# Patient Record
Sex: Male | Born: 2000 | Race: Black or African American | Hispanic: No | Marital: Single | Smoking: Never smoker
Health system: Southern US, Community
[De-identification: ages and names within clinical notes are randomized; demographics above are authoritative.]

## PROBLEM LIST (undated history)

## (undated) DIAGNOSIS — J45909 Unspecified asthma, uncomplicated: Secondary | ICD-10-CM

## (undated) HISTORY — PX: WISDOM TOOTH EXTRACTION: SHX21

---
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
## 2023-09-16 IMAGING — CT CT 3D ACQUISTION WKST
4 of 6 series · 14 of 36 positions shown, 16 images · non-contrast
Comparison: Radiographs 09/06/2021

CLINICAL DATA: Fall 3-4 weeks ago.  Left radial head fracture.

EXAM:
CT OF THE UPPER LEFT EXTREMITY WITHOUT CONTRAST
3-DIMENSIONAL CT IMAGE RENDERING ON ACQUISITION WORKSTATION
TECHNIQUE: Multidetector CT imaging of the left elbow was performed according
to the standard protocol.

[Series 3: elbow 1.50 br60 s3 axial bone hd fov · axial · 0.38mm/px · z∈[-732,-603]mm · 5 of 244 slices shown, 7 images]
[im 41/244  soft-tissue]
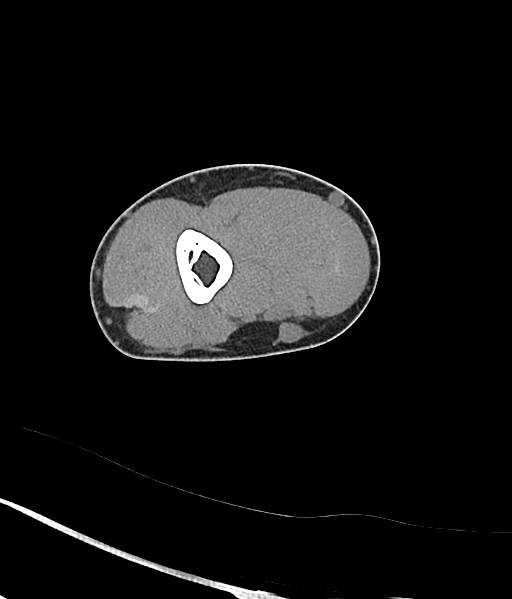
[im 41/244  bone]
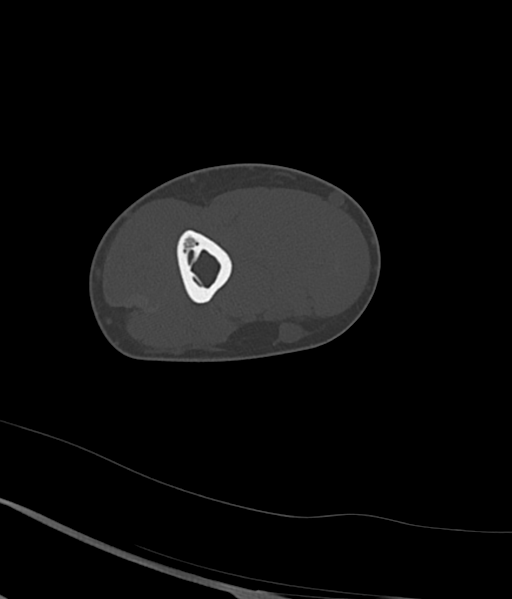
[im 82/244  bone]
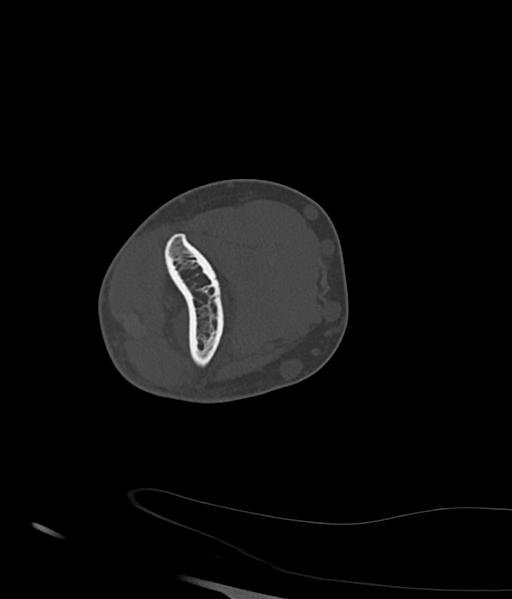
[im 122/244  bone]
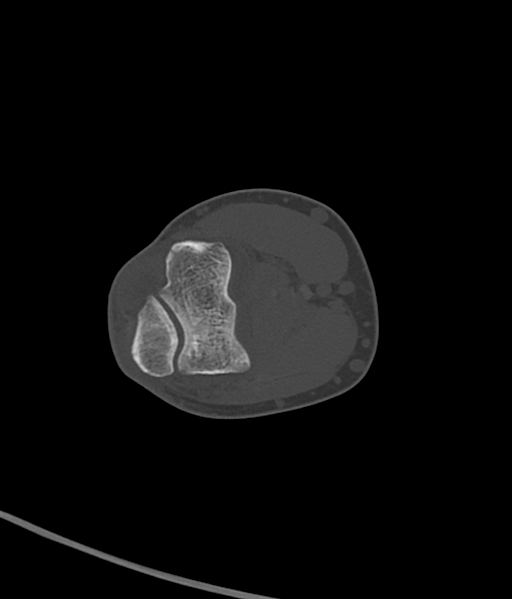
[im 163/244  bone]
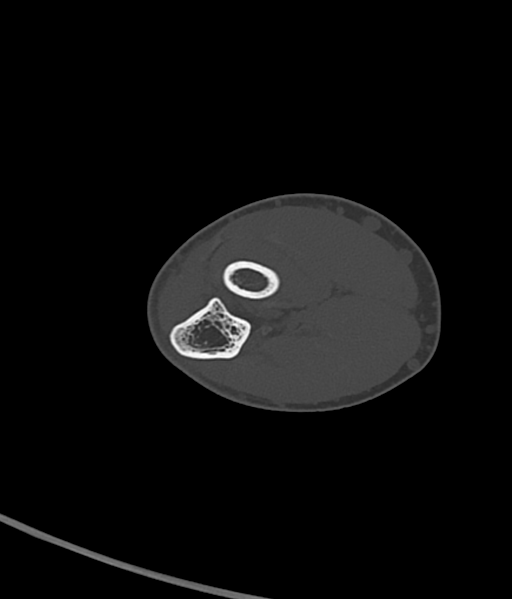
[im 203/244  soft-tissue]
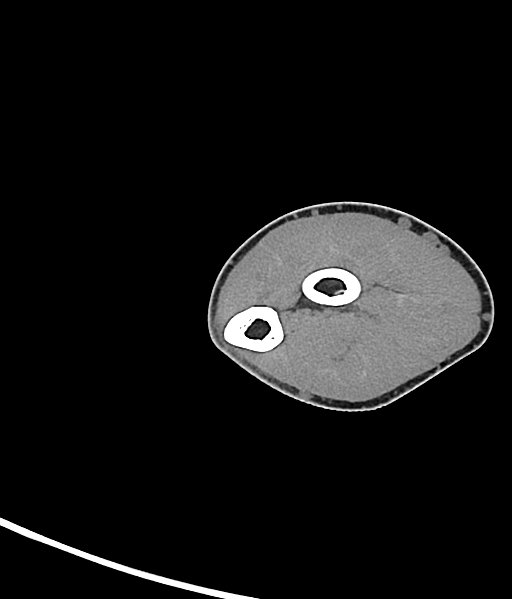
[im 203/244  bone]
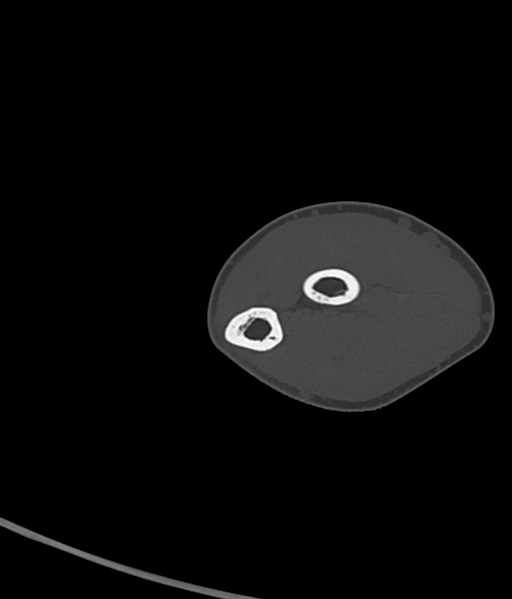

[Series 5: elbow 1.50 br40 s3 axial st hd fov · axial · 0.38mm/px · z∈[-732,-701]mm · 2 of 243 slices shown]
[im 41/243  bone]
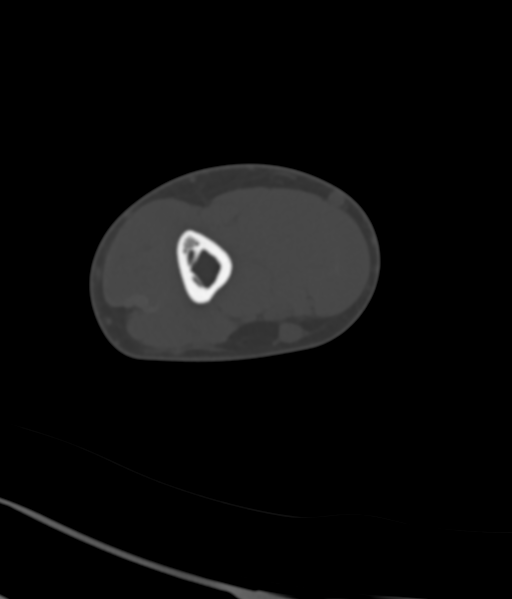
[im 81/243  bone]
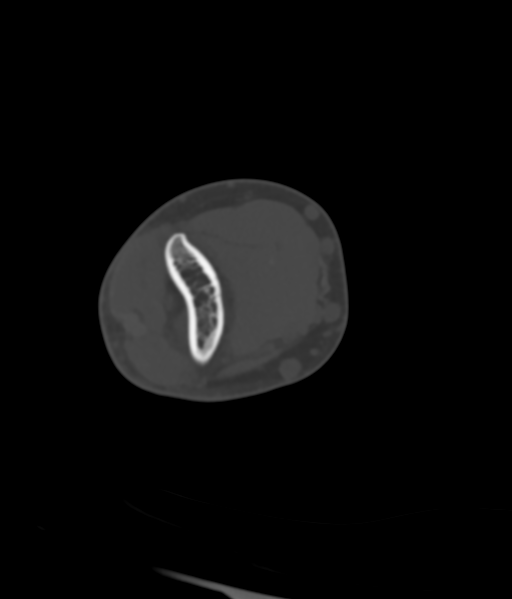

[Series 9: elbow 1.50 hr60 s3 cor bone hd fov · coronal · 0.38mm/px · 1 of 280 slices shown]
[im 140/280  bone]
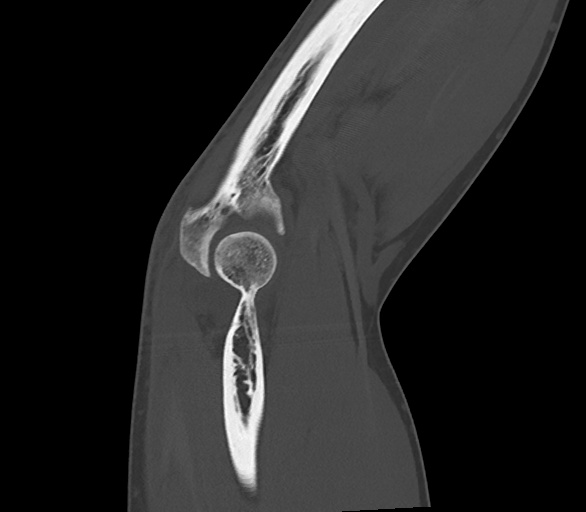

[Series 13: elbow 1.50 br60 s3 sag bone hd fov · sagittal · 0.40mm/px · 6 of 280 slices shown]
[im 47/280  bone]
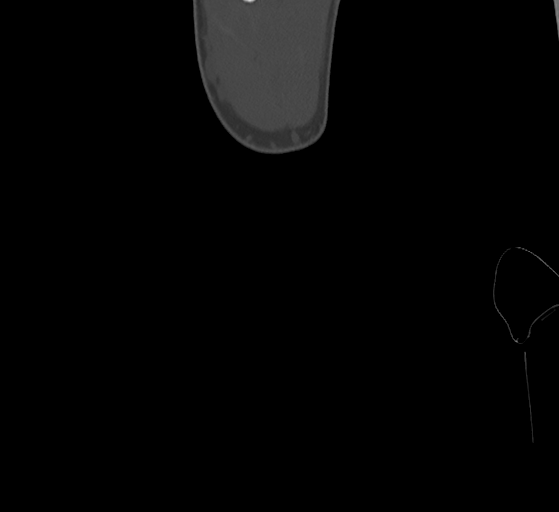
[im 48/280  soft-tissue]
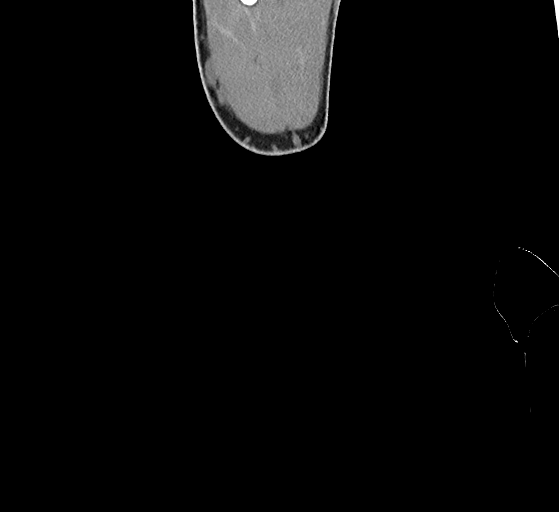
[im 94/280  bone]
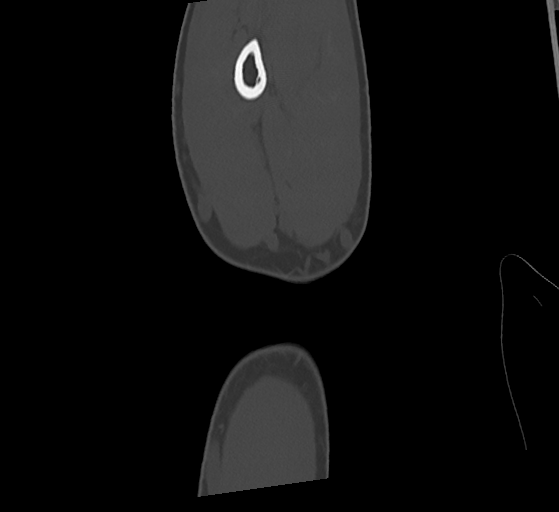
[im 140/280  bone]
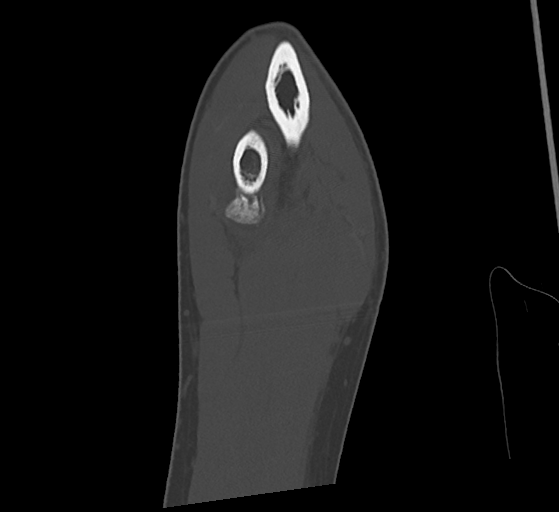
[im 187/280  bone]
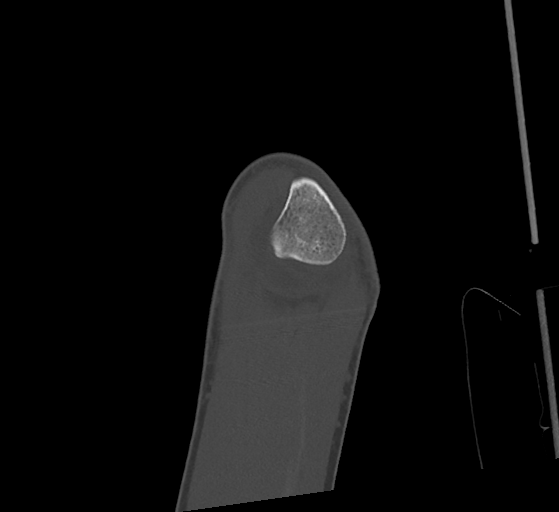
[im 233/280  bone]
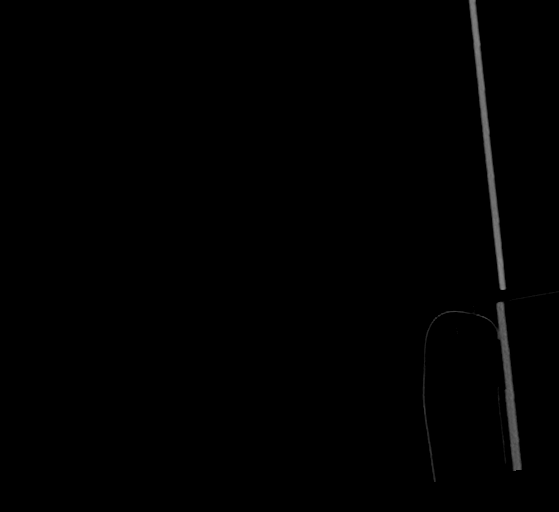

[14 of 36 positions shown; findings below may reference images not displayed]

3-dimensional CT images were rendered by post-processing of the
original CT data on an acquisition workstation. The 3-dimensional CT
images were interpreted and findings were reported in the
accompanying complete CT report for this study

RADIATION DOSE REDUCTION: This exam was performed according to the
departmental dose-optimization program which includes automated
exposure control, adjustment of the mA and/or kV according to
patient size and/or use of iterative reconstruction technique.
FINDINGS: Bones/Joint/Cartilage

As seen radiographically, there is a comminuted and depressed
intra-articular fracture involving the anterior 3rd of the left
radial head. There is up to 4 mm of displacement of the articular
surface which is depressed by 3 mm anteriorly. The radial head
remains located. There is a linear fracture fragment anteriorly
within the joint, measuring up to 9 mm in greatest dimension on
sagittal image 156/9. There is a moderate to large hemarthrosis. The
proximal ulna and distal humerus are intact.

Ligaments

Suboptimally assessed by CT.

Muscles and Tendons

The biceps, brachialis and triceps tendons appear intact. No focal
muscular abnormalities are seen.

Soft tissues

No foreign body, soft tissue emphysema or focal fluid collection.
Soft tissue swelling noted posterolaterally.
IMPRESSION: 1. Comminuted intra-articular fracture involving the anterior 3rd of
the left radial head as described. There is a large displaced
fracture fragment anteriorly in coronoid fossa and the articular
surface of the radial head is depressed by up to 3 mm.
2. Large hemarthrosis.
3. The distal humerus and proximal ulna are intact.  No dislocation.

## 2023-09-16 IMAGING — CT CT ELBOW*L* W/O CM
4 of 6 series · 14 of 36 positions shown, 16 images · non-contrast
Comparison: Radiographs 09/06/2021

CLINICAL DATA: Fall 3-4 weeks ago.  Left radial head fracture.

EXAM:
CT OF THE UPPER LEFT EXTREMITY WITHOUT CONTRAST
3-DIMENSIONAL CT IMAGE RENDERING ON ACQUISITION WORKSTATION
TECHNIQUE: Multidetector CT imaging of the left elbow was performed according
to the standard protocol.

[Series 3: elbow 1.50 br60 s3 axial bone hd fov · axial · 0.38mm/px · z∈[-732,-603]mm · 5 of 244 slices shown, 7 images]
[im 41/244  soft-tissue]
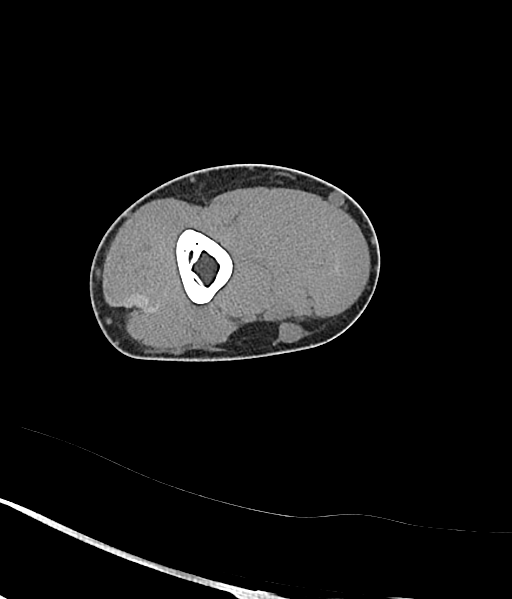
[im 41/244  bone]
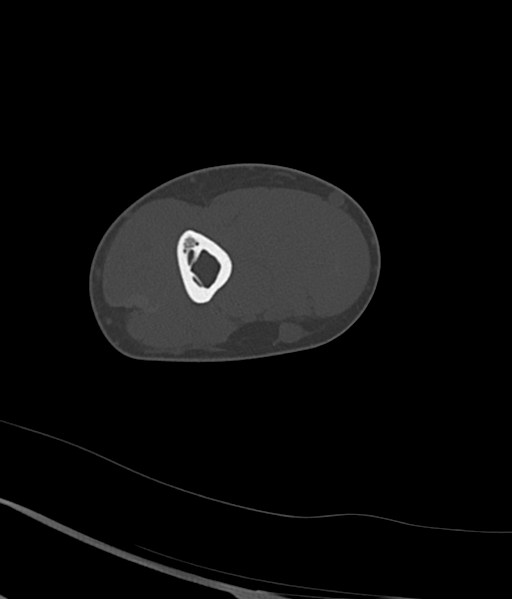
[im 82/244  bone]
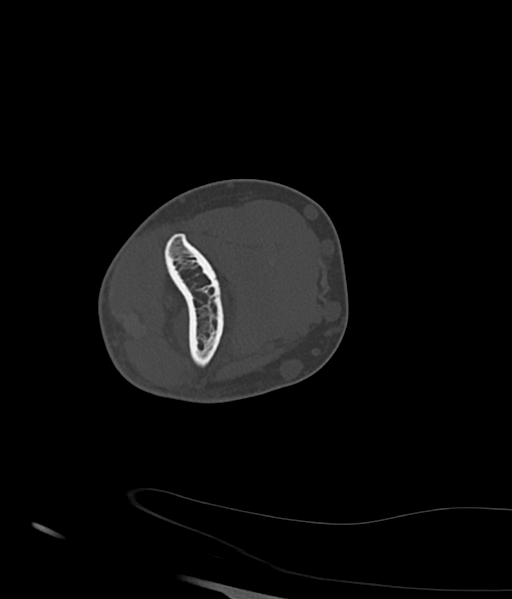
[im 122/244  bone]
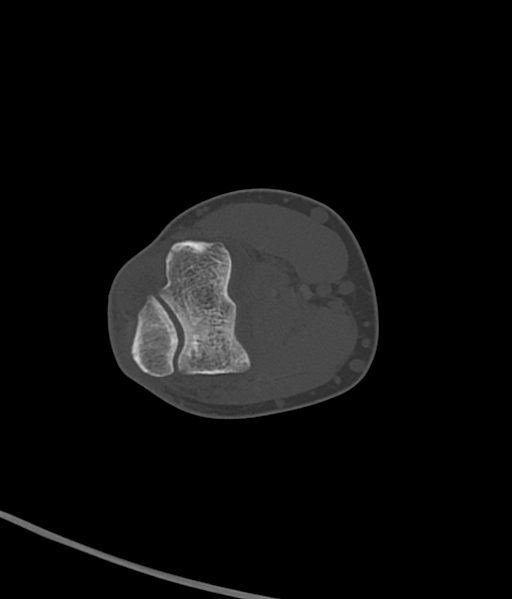
[im 163/244  bone]
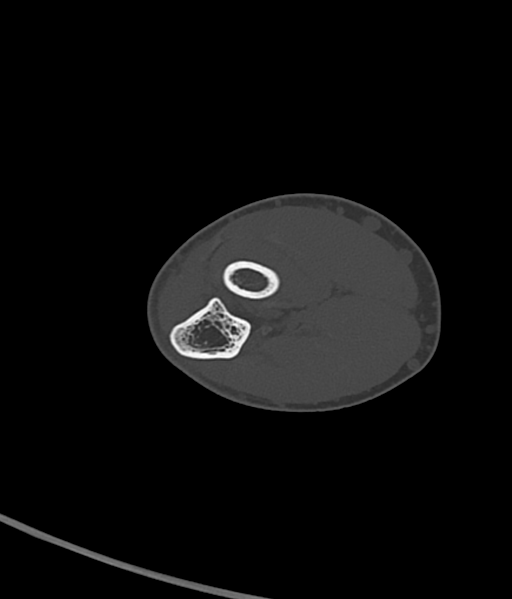
[im 203/244  soft-tissue]
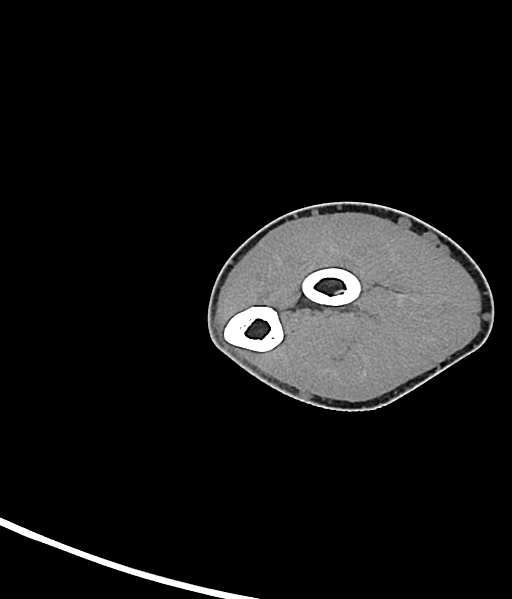
[im 203/244  bone]
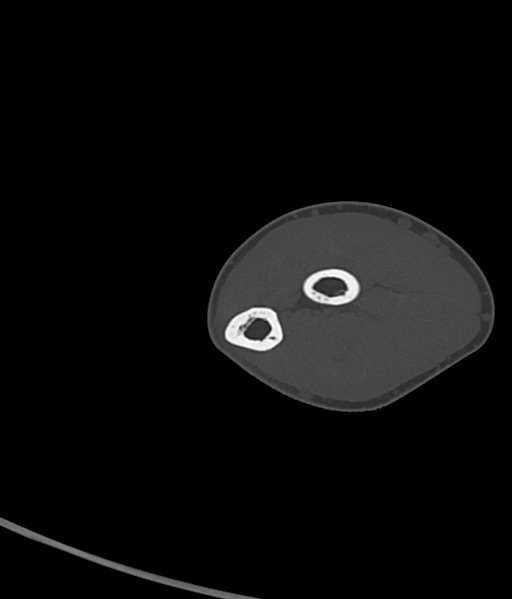

[Series 5: elbow 1.50 br40 s3 axial st hd fov · axial · 0.38mm/px · z∈[-732,-701]mm · 2 of 243 slices shown]
[im 41/243  bone]
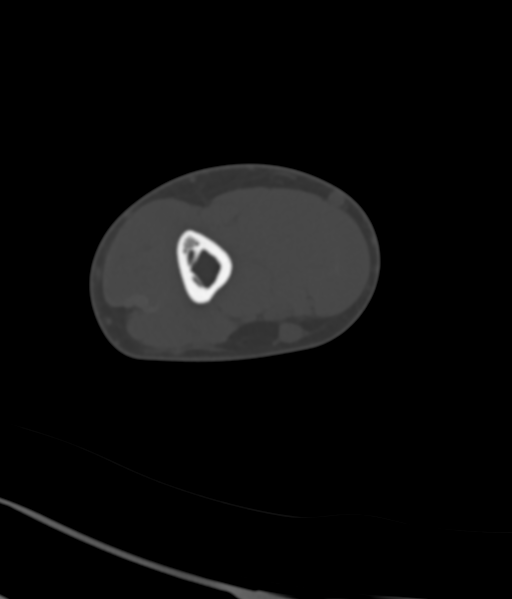
[im 81/243  bone]
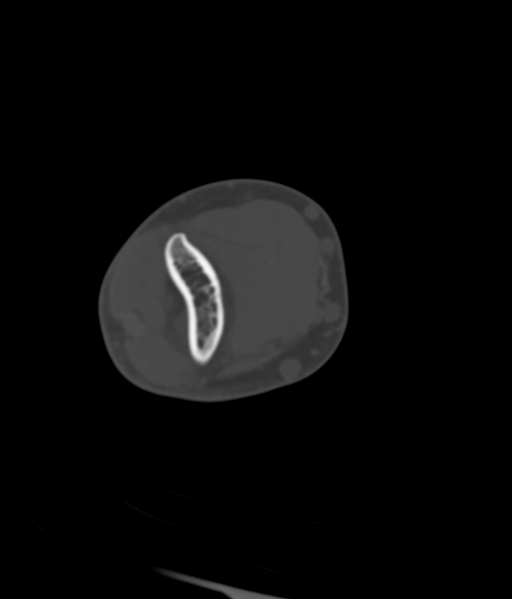

[Series 9: elbow 1.50 hr60 s3 cor bone hd fov · coronal · 0.38mm/px · 1 of 280 slices shown]
[im 140/280  bone]
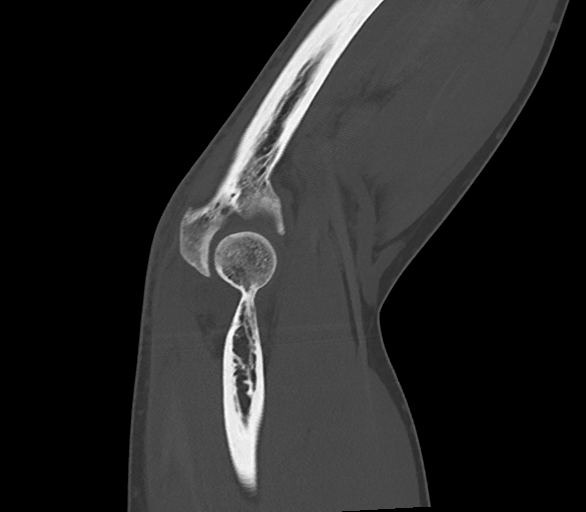

[Series 13: elbow 1.50 br60 s3 sag bone hd fov · sagittal · 0.40mm/px · 6 of 280 slices shown]
[im 47/280  bone]
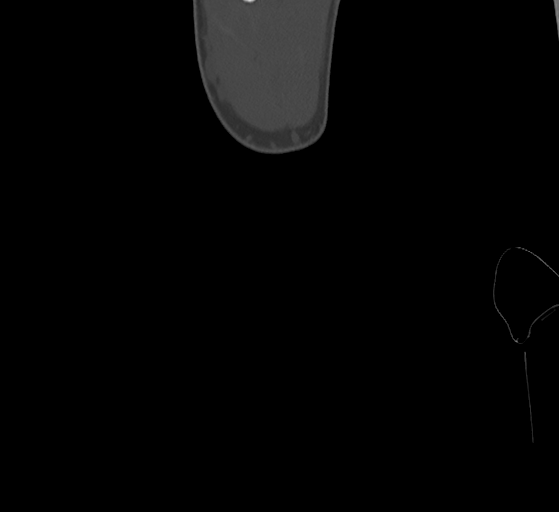
[im 48/280  soft-tissue]
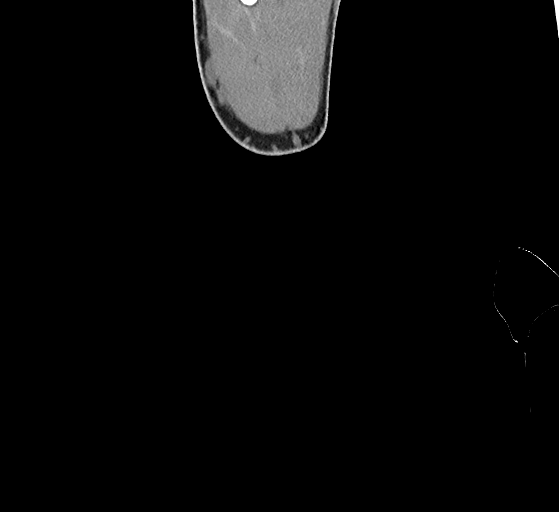
[im 94/280  bone]
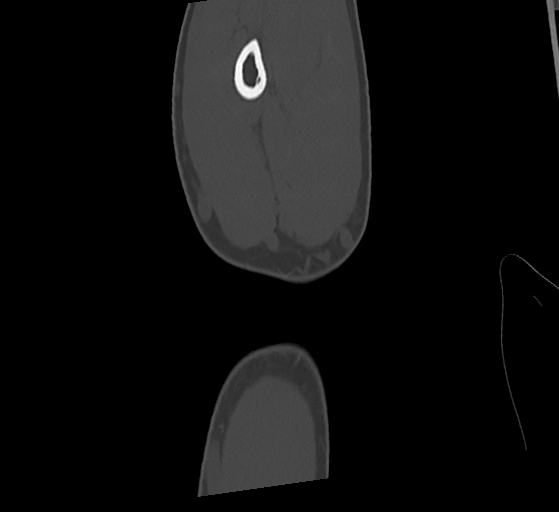
[im 140/280  bone]
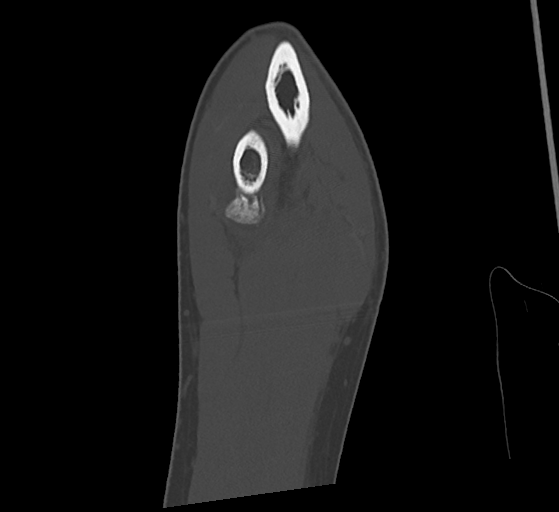
[im 187/280  bone]
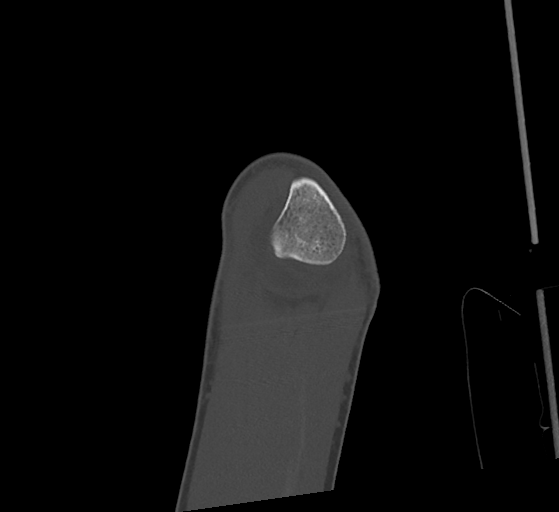
[im 233/280  bone]
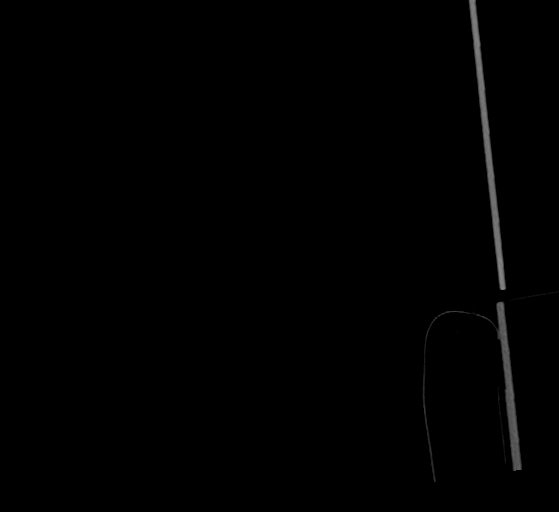

[14 of 36 positions shown; findings below may reference images not displayed]

3-dimensional CT images were rendered by post-processing of the
original CT data on an acquisition workstation. The 3-dimensional CT
images were interpreted and findings were reported in the
accompanying complete CT report for this study

RADIATION DOSE REDUCTION: This exam was performed according to the
departmental dose-optimization program which includes automated
exposure control, adjustment of the mA and/or kV according to
patient size and/or use of iterative reconstruction technique.
FINDINGS: Bones/Joint/Cartilage

As seen radiographically, there is a comminuted and depressed
intra-articular fracture involving the anterior 3rd of the left
radial head. There is up to 4 mm of displacement of the articular
surface which is depressed by 3 mm anteriorly. The radial head
remains located. There is a linear fracture fragment anteriorly
within the joint, measuring up to 9 mm in greatest dimension on
sagittal image 156/9. There is a moderate to large hemarthrosis. The
proximal ulna and distal humerus are intact.

Ligaments

Suboptimally assessed by CT.

Muscles and Tendons

The biceps, brachialis and triceps tendons appear intact. No focal
muscular abnormalities are seen.

Soft tissues

No foreign body, soft tissue emphysema or focal fluid collection.
Soft tissue swelling noted posterolaterally.
IMPRESSION: 1. Comminuted intra-articular fracture involving the anterior 3rd of
the left radial head as described. There is a large displaced
fracture fragment anteriorly in coronoid fossa and the articular
surface of the radial head is depressed by up to 3 mm.
2. Large hemarthrosis.
3. The distal humerus and proximal ulna are intact.  No dislocation.

## 2024-03-30 ENCOUNTER — Other Ambulatory Visit: Payer: Self-pay

## 2024-03-30 ENCOUNTER — Emergency Department (HOSPITAL_BASED_OUTPATIENT_CLINIC_OR_DEPARTMENT_OTHER): Payer: Self-pay

## 2024-03-30 ENCOUNTER — Emergency Department (HOSPITAL_BASED_OUTPATIENT_CLINIC_OR_DEPARTMENT_OTHER)
Admission: EM | Admit: 2024-03-30 | Discharge: 2024-03-30 | Disposition: A | Discharge status: Home / Self Care | Payer: Self-pay | Attending: Emergency Medicine | Admitting: Emergency Medicine

## 2024-03-30 ENCOUNTER — Encounter (HOSPITAL_BASED_OUTPATIENT_CLINIC_OR_DEPARTMENT_OTHER): Payer: Self-pay

## 2024-03-30 DIAGNOSIS — S63502A Unspecified sprain of left wrist, initial encounter: Secondary | ICD-10-CM | POA: Insufficient documentation

## 2024-03-30 DIAGNOSIS — X501XXA Overexertion from prolonged static or awkward postures, initial encounter: Secondary | ICD-10-CM | POA: Insufficient documentation

## 2024-03-30 MED ORDER — IBUPROFEN 400 MG PO TABS
400.0000 mg | ORAL_TABLET | Freq: Once | ORAL | Status: AC
  Administered 2024-03-30: 12:00:00 400 mg via ORAL
  Filled 2024-03-30: qty 1

## 2024-03-30 NOTE — ED Triage Notes (Signed)
 He reports feeling something pull at distal left forearm yesterday while lifting something. He is here with persistent diatal left forearm/srist area discomfort.

## 2024-03-30 NOTE — ED Provider Notes (Signed)
 [facility] AT [facility] Provider Note   CSN: [id number] Arrival date & time: 03/30/24  1018     Patient presents with: Arm Pain   Thomas Guerra is a 23 y.o. male.   Patient is a healthy 23 year old male who is presenting today with ongoing left wrist pain.  He reports he was moving chairs at [employer]	[place] [employer] yesterday when he heard and felt a pop in his left wrist.  He has had severe pain since that time and even has made his fingers feel numb and tingly.  Any movement seems to make it worse.  He is right-handed.  The history is provided by the patient.  Arm Pain This is a new problem.       Prior to Admission medications  Medication Sig Start Date End Date Taking? Authorizing Provider  albuterol (PROVENTIL HFA;VENTOLIN HFA) 108 (90 BASE) MCG/ACT inhaler Inhale 2 puffs into the lungs every 6 (six) hours as needed for wheezing or shortness of breath.    [provider]  cetirizine (ZYRTEC) 10 MG tablet Take 10 mg by mouth daily.    [provider]  diclofenac  (VOLTAREN ) 75 MG EC tablet Take 1 tablet (75 mg total) by mouth 2 (two) times daily. 10/03/21   McBane, Caroline N, PA-C  sodium chloride  (OCEAN) 0.65 % SOLN nasal spray Place 1 spray into both nostrils as needed for congestion.    [provider]    Allergies: Patient has no known allergies.    Review of Systems  Updated Vital Signs BP (!) 154/86 (BP Location: Right Arm)   Pulse 72   Temp 97.9 F (36.6 C) (Oral)   Resp 17   SpO2 99%   Physical Exam Vitals reviewed.  HENT:     Head: Normocephalic.  Cardiovascular:     Pulses: Normal pulses.  Musculoskeletal:        General: Tenderness present.     Left wrist: Tenderness and bony tenderness present.     Comments: Pain with palpation over the left distal ulna.  Pain with extension of the wrist.  No snuffbox or radial tenderness.  Less than 2-second capillary refill of the fingers  Neurological:      Mental Status: He is alert. Mental status is at baseline.     (all labs ordered are listed, but only abnormal results are displayed) Labs Reviewed - No data to display  EKG: None  Radiology: DG Wrist Complete Left Result Date: 03/30/2024 CLINICAL DATA:  Left wrist injury EXAM: LEFT WRIST - COMPLETE 3+ VIEW COMPARISON:  Sep 06, 2021 FINDINGS: There is no evidence of fracture or dislocation. There is no evidence of arthropathy or other focal bone abnormality. Soft tissues are unremarkable. IMPRESSION: Negative. Electronically Signed   By: Lynwood Landy Raddle M.D.   On: 03/30/2024 11:04     Procedures   Medications Ordered in the ED  ibuprofen  (ADVIL ) tablet 400 mg (400 mg Oral Given 03/30/24 1131)                                    Medical Decision Making Amount and/or Complexity of Data Reviewed Radiology: ordered and independent interpretation performed. Decision-making details documented in ED Course.  Risk Prescription drug management.   Patient presenting today after an injury to his wrist yesterday.  Neurovascularly intact.  No signs of infection, suspects strain but will get images to ensure no other acute  bony pathology. I have independently visualized and interpreted pt's images today.  Plain film is negative for acute findings.  Patient placed in a wrist splint and discharged home.    Final diagnoses:  Left wrist sprain, initial encounter    ED Discharge Orders     None          Doretha Folks, MD 03/30/24 1131

## 2024-03-30 NOTE — Discharge Instructions (Addendum)
 The x-ray looks normal today.  You most likely just sprained the wrist.  Wear the brace for the next week and then you can start doing range of motion and seeing how things feel without the brace.  You can take Tylenol  or ibuprofen  as needed for pain.  Avoid any lifting with that hand.
